# Patient Record
Sex: Female | Born: 2000 | Race: Black or African American | Hispanic: No | Marital: Single | Smoking: Never smoker
Health system: Southern US, Community
[De-identification: ages and names within clinical notes are randomized; demographics above are authoritative.]

---

## 2018-07-28 ENCOUNTER — Emergency Department (HOSPITAL_COMMUNITY): Payer: 59

## 2018-07-28 ENCOUNTER — Other Ambulatory Visit: Payer: Self-pay

## 2018-07-28 ENCOUNTER — Encounter (HOSPITAL_COMMUNITY): Payer: Self-pay

## 2018-07-28 ENCOUNTER — Emergency Department (HOSPITAL_COMMUNITY)
Admission: EM | Admit: 2018-07-28 | Discharge: 2018-07-28 | Disposition: A | Discharge status: Home / Self Care | Payer: 59 | Attending: Emergency Medicine | Admitting: Emergency Medicine

## 2018-07-28 DIAGNOSIS — M25511 Pain in right shoulder: Secondary | ICD-10-CM | POA: Diagnosis present

## 2018-07-28 MED ORDER — METHOCARBAMOL 500 MG PO TABS: 500.0000 mg | ORAL_TABLET | Freq: Two times a day (BID) | ORAL | 0 refills | Status: AC
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
## 2018-07-28 NOTE — Discharge Instructions (Addendum)

## 2021-01-04 ENCOUNTER — Encounter: Payer: Self-pay | Admitting: Emergency Medicine

## 2021-01-04 ENCOUNTER — Other Ambulatory Visit: Payer: Self-pay

## 2021-01-04 ENCOUNTER — Emergency Department (INDEPENDENT_AMBULATORY_CARE_PROVIDER_SITE_OTHER)
Admission: EM | Admit: 2021-01-04 | Discharge: 2021-01-04 | Disposition: A | Discharge status: Home / Self Care | Payer: No Typology Code available for payment source | Source: Home / Self Care

## 2021-01-04 ENCOUNTER — Emergency Department (INDEPENDENT_AMBULATORY_CARE_PROVIDER_SITE_OTHER): Payer: No Typology Code available for payment source

## 2021-01-04 DIAGNOSIS — S86912A Strain of unspecified muscle(s) and tendon(s) at lower leg level, left leg, initial encounter: Secondary | ICD-10-CM

## 2021-01-04 DIAGNOSIS — M25562 Pain in left knee: Secondary | ICD-10-CM

## 2021-01-04 MED ORDER — IBUPROFEN 800 MG PO TABS: 800.0000 mg | ORAL_TABLET | Freq: Three times a day (TID) | ORAL | 0 refills | Status: AC

## 2021-01-04 MED ORDER — IBUPROFEN 800 MG PO TABS
800.0000 mg | ORAL_TABLET | Freq: Once | ORAL | Status: AC
  Administered 2021-01-04: 800 mg via ORAL

## 2021-01-04 NOTE — ED Triage Notes (Signed)
Patient here for pain in left knee; yesterday she twisted leg as she sat down and heard "pop";  thought it was ok but today she cannot bear weight and wonders if she dislocated it. Has not taken anything for pain; did use ice last night. Has had all covid vaccinations.

## 2021-01-04 NOTE — ED Provider Notes (Signed)
[facility]    CSN: [id number] Arrival date & time: 01/04/21  1191      History   Chief Complaint Chief Complaint  Patient presents with  . Knee Injury    HPI Kayla Luna is a 20 y.o. female.   HPI 20 year old female presents with left knee pain for 1 day.  Reports that she twisted her left leg as she was sitting down and heard a pop in her left knee and has been unable to bear weight since.  No past medical history on file.  There are no problems to display for this patient.   History reviewed. No pertinent surgical history.  OB History   No obstetric history on file.      Home Medications    Prior to Admission medications   Medication Sig Start Date End Date Taking? Authorizing Provider  ibuprofen (ADVIL) 800 MG tablet Take 1 tablet (800 mg total) by mouth 3 (three) times daily. 01/04/21  Yes Trevor Iha, FNP  methocarbamol (ROBAXIN) 500 MG tablet Take 1 tablet (500 mg total) by mouth 2 (two) times daily. 07/28/18   Maxwell Caul, PA-C    Family History No family history on file.  Social History Social History   Tobacco Use  . Smoking status: Never  Substance Use Topics  . Alcohol use: Never  . Drug use: Never     Allergies   Pepto-bismol [bismuth]   Review of Systems Review of Systems  Musculoskeletal:        Left knee pain x 1 day  All other systems reviewed and are negative.   Physical Exam Triage Vital Signs ED Triage Vitals  Enc Vitals Group     BP --      Pulse --      Resp --      Temp --      Temp src --      SpO2 --      Weight 01/04/21 0859 157 lb (71.2 kg)     Height 01/04/21 0859 5\' 3"  (1.6 m)     Head Circumference --      Peak Flow --      Pain Score 01/04/21 0858 7     Pain Loc --      Pain Edu? --      Excl. in GC? --    No data found.  Updated Vital Signs BP 119/76 (BP Location: Right Arm)   Pulse 73   Temp 98.3 F (36.8 C) (Oral)   Resp 16   Ht 5\' 3"  (1.6 m)   Wt 157 lb (71.2 kg)   LMP  12/27/2020   SpO2 100%   BMI 27.81 kg/m    Physical Exam Vitals and nursing note reviewed.  Constitutional:      General: She is not in acute distress.    Appearance: Normal appearance. She is normal weight. She is not ill-appearing.  HENT:     Head: Normocephalic and atraumatic.     Mouth/Throat:     Mouth: Mucous membranes are moist.     Pharynx: Oropharynx is clear.  Eyes:     Extraocular Movements: Extraocular movements intact.     Conjunctiva/sclera: Conjunctivae normal.     Pupils: Pupils are equal, round, and reactive to light.  Cardiovascular:     Rate and Rhythm: Normal rate and regular rhythm.     Pulses: Normal pulses.     Heart sounds: Normal heart sounds.  Pulmonary:  Effort: Pulmonary effort is normal.     Breath sounds: Normal breath sounds.     Comments: No adventitious breath sounds noted Musculoskeletal:     Cervical back: Normal range of motion and neck supple. No tenderness.     Comments: Left knee: TTP over superior medial aspect of patella, moderate soft tissue swelling noted, LROM with seated flexion, exam limited due to current pain, 800 mg PO Ibuprofen provided to patient just prior to x-ray of left knee  Lymphadenopathy:     Cervical: No cervical adenopathy.  Skin:    General: Skin is warm and dry.  Neurological:     General: No focal deficit present.     Mental Status: She is alert and oriented to person, place, and time.  Psychiatric:        Mood and Affect: Mood normal.        Behavior: Behavior normal.     UC Treatments / Results  Labs (all labs ordered are listed, but only abnormal results are displayed) Labs Reviewed - No data to display  EKG   Radiology DG Knee Complete 4 Views Left  Result Date: 01/04/2021 CLINICAL DATA:  19 year old female status post twisting injury yesterday, "Heard a pop". Medial pain and unable to weightbear. EXAM: LEFT KNEE - COMPLETE 4+ VIEW COMPARISON:  None. FINDINGS: Lateral view is mildly obliqued  but no definite joint effusion. Patella appears intact. Joint spaces and alignment are within normal limits. Bone mineralization is within normal limits. No acute osseous abnormality identified. Soft tissue planes appear to remain normal. IMPRESSION: Negative radiographic appearance of the left knee. Electronically Signed   By: Odessa Fleming M.D.   On: 01/04/2021 09:30    Procedures Procedures (including critical care time)  Medications Ordered in UC Medications  ibuprofen (ADVIL) tablet 800 mg (800 mg Oral Given 01/04/21 0912)    Initial Impression / Assessment and Plan / UC Course  I have reviewed the triage vital signs and the nursing notes.  Pertinent labs & imaging results that were available during my care of the patient were reviewed by me and considered in my medical decision making (see chart for details).    MDM: 1. Left knee pain-left knee x-ray unremarkable, 2.  Left knee strain-Ibuprofen, RICE, Ace wrap, crutches, as needed orthopedic follow-up for the next 7 to 10 days.  Patient discharged home, hemodynamically stable. Final Clinical Impressions(s) / UC Diagnoses   Final diagnoses:  Acute pain of left knee  Knee strain, left, initial encounter     Discharge Instructions      Advised patient may take 800 mg of Ibuprofen 2-3 times daily, as needed for the next 5 to 7 days.  Encouraged patient to RICE left knee 2-3 times daily for 20 minutes for the next 3 days.  Advised patient to avoid weightbearing, repetitive motion activities involving left knee for the next 7 to 10 days.  Orthopedic providers have been added to this AVS for follow-up if need be.     ED Prescriptions     Medication Sig Dispense Auth. Provider   ibuprofen (ADVIL) 800 MG tablet Take 1 tablet (800 mg total) by mouth 3 (three) times daily. 21 tablet Trevor Iha, FNP      PDMP not reviewed this encounter.   Trevor Iha, FNP 01/04/21 1048

## 2021-01-04 NOTE — Discharge Instructions (Addendum)
Advised patient may take 800 mg of Ibuprofen 2-3 times daily, as needed for the next 5 to 7 days.  Encouraged patient to RICE left knee 2-3 times daily for 20 minutes for the next 3 days.  Advised patient to avoid weightbearing, repetitive motion activities involving left knee for the next 7 to 10 days.  Orthopedic providers have been added to this AVS for follow-up if need be.

## 2022-09-12 IMAGING — DX DG KNEE COMPLETE 4+V*L*
4 series · 4 of 4 positions shown · non-contrast
Comparison: None.

CLINICAL DATA: 19-year-old female status post twisting injury
yesterday, "Heard a pop". Medial pain and unable to weightbear.

EXAM:
LEFT KNEE - COMPLETE 4+ VIEW

[knee ap]
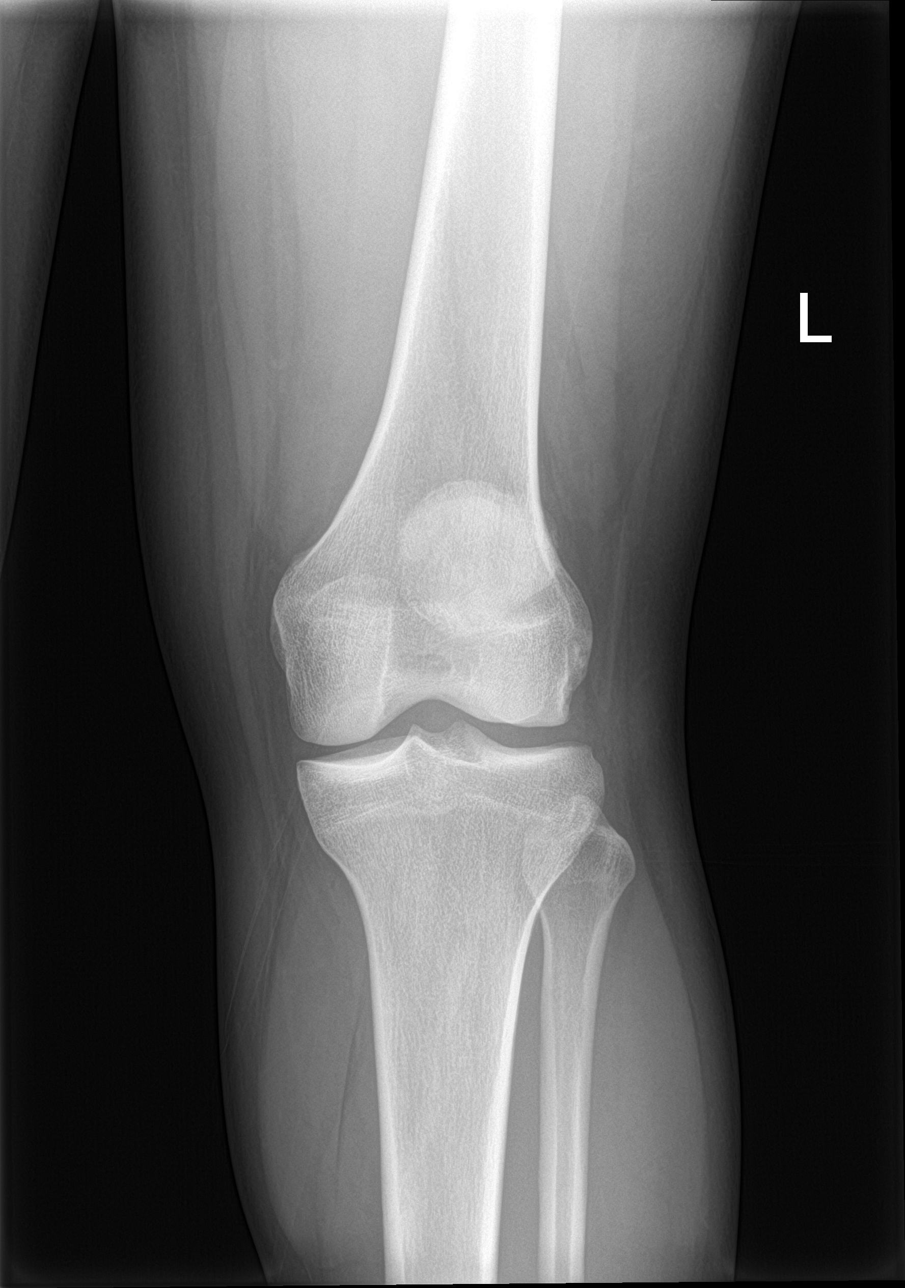

[knee lat]
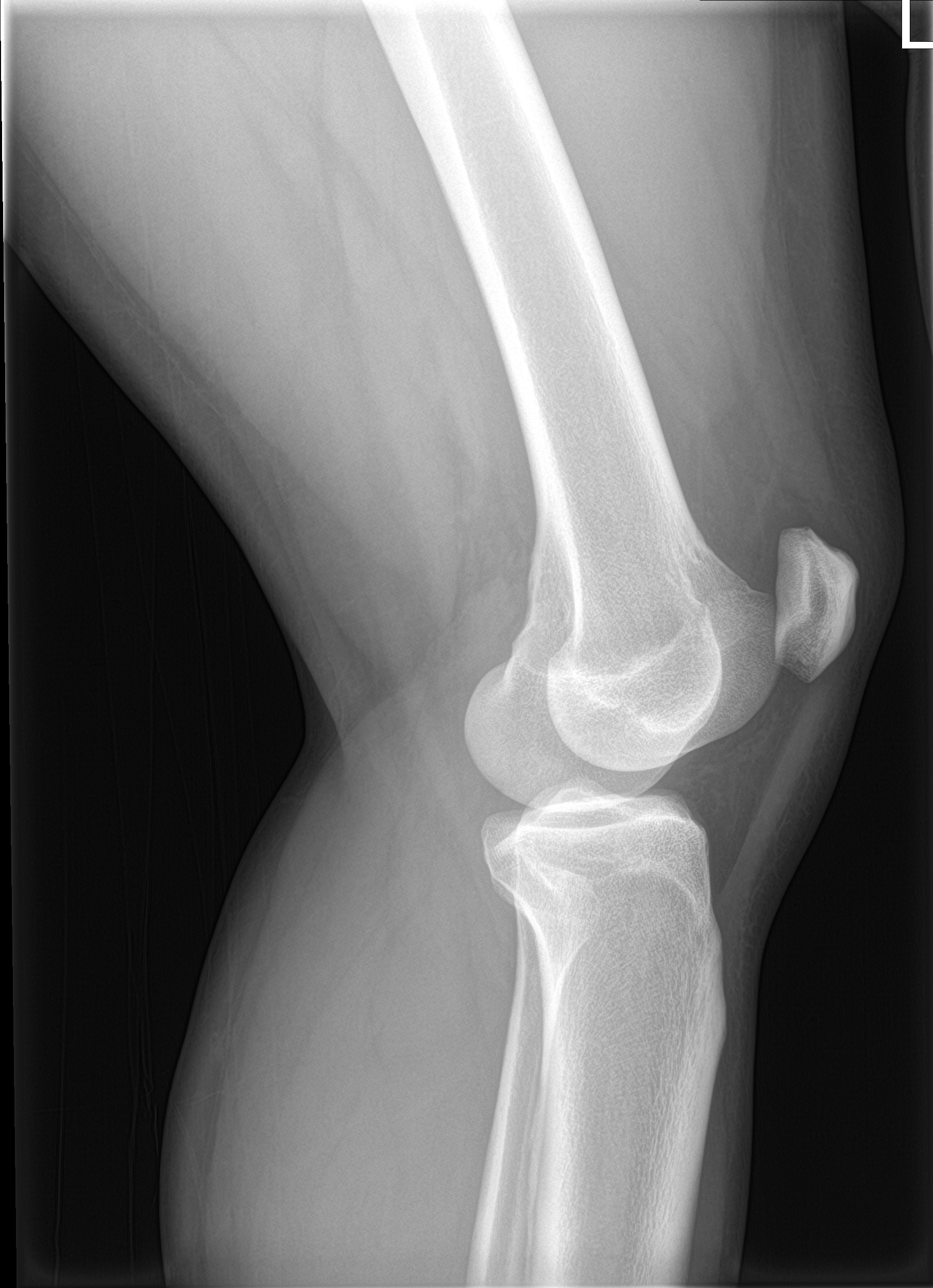

[knee obl (1 of 2)]
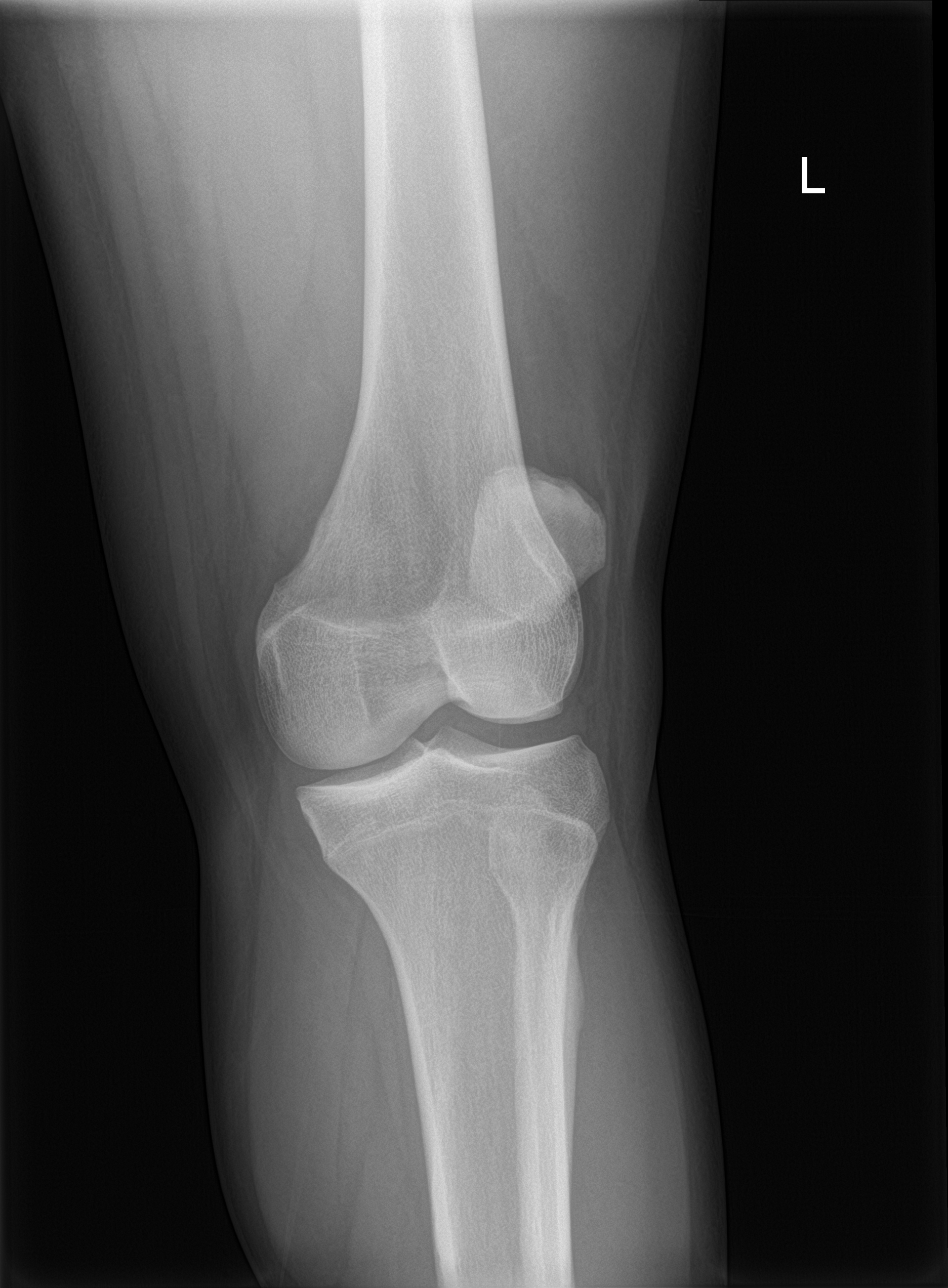

[knee obl (2 of 2)]
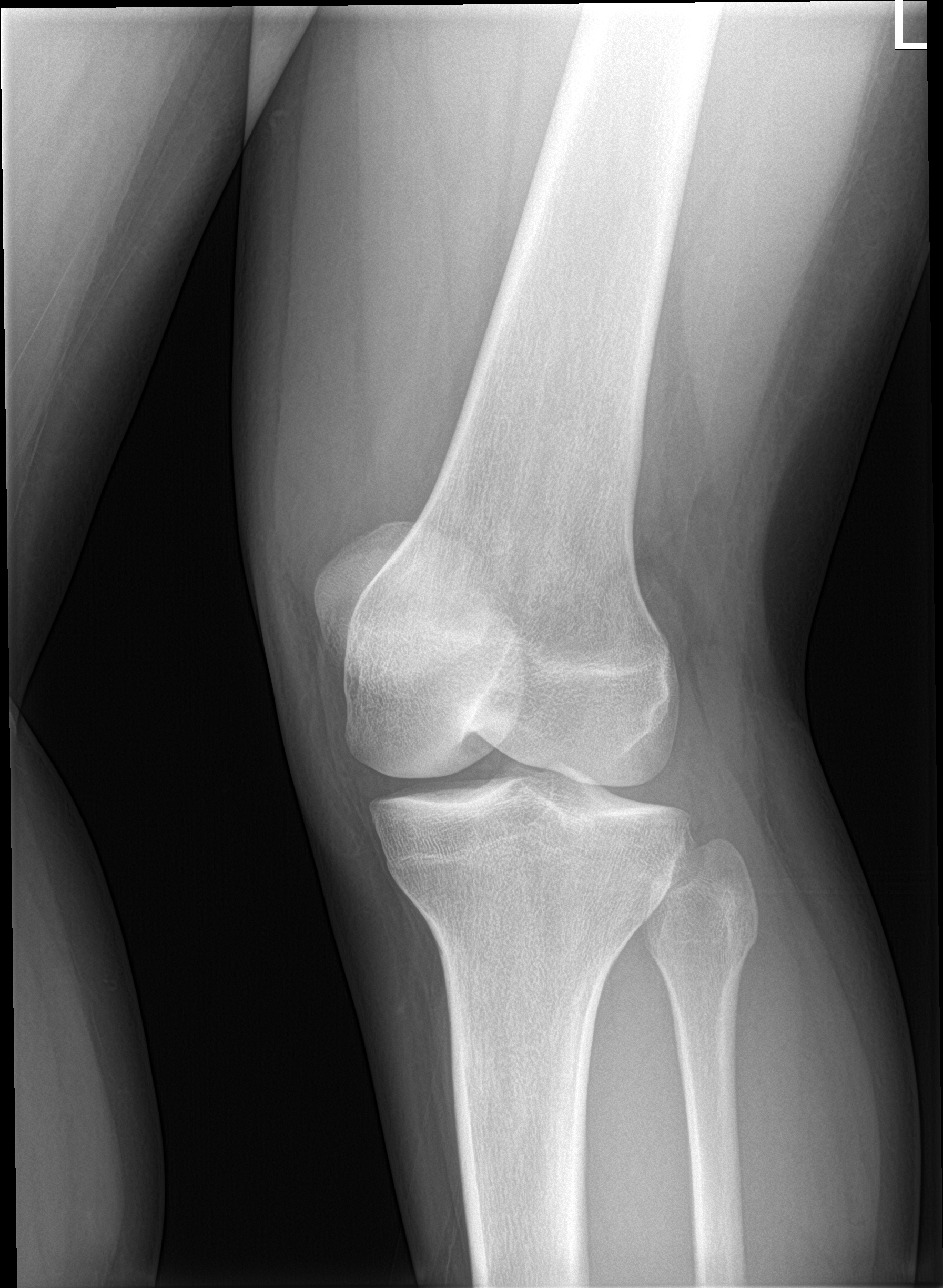

[4 of 4 positions shown; findings below may reference images not displayed]

FINDINGS: Lateral view is mildly obliqued but no definite joint effusion.
Patella appears intact. Joint spaces and alignment are within normal
limits. Bone mineralization is within normal limits. No acute
osseous abnormality identified. Soft tissue planes appear to remain
normal.
IMPRESSION: Negative radiographic appearance of the left knee.
